# Patient Record
Sex: Male | Born: 2000 | Race: White | Hispanic: No | Marital: Single | Smoking: Never smoker
Health system: Southern US, Community
[De-identification: ages and names within clinical notes are randomized; demographics above are authoritative.]

## PROBLEM LIST (undated history)

## (undated) DIAGNOSIS — J309 Allergic rhinitis, unspecified: Secondary | ICD-10-CM

## (undated) HISTORY — DX: Allergic rhinitis, unspecified: J30.9

## (undated) HISTORY — PX: HAND SURGERY: SHX662

---
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
## 2020-03-11 IMAGING — DX DG FOOT COMPLETE 3+V*L*
3 series · 3 of 3 positions shown · non-contrast
Comparison: None.

CLINICAL DATA: Injury to LEFT foot while at work, pain and
swelling.

EXAM:
LEFT FOOT - COMPLETE 3+ VIEW

[foot ap]
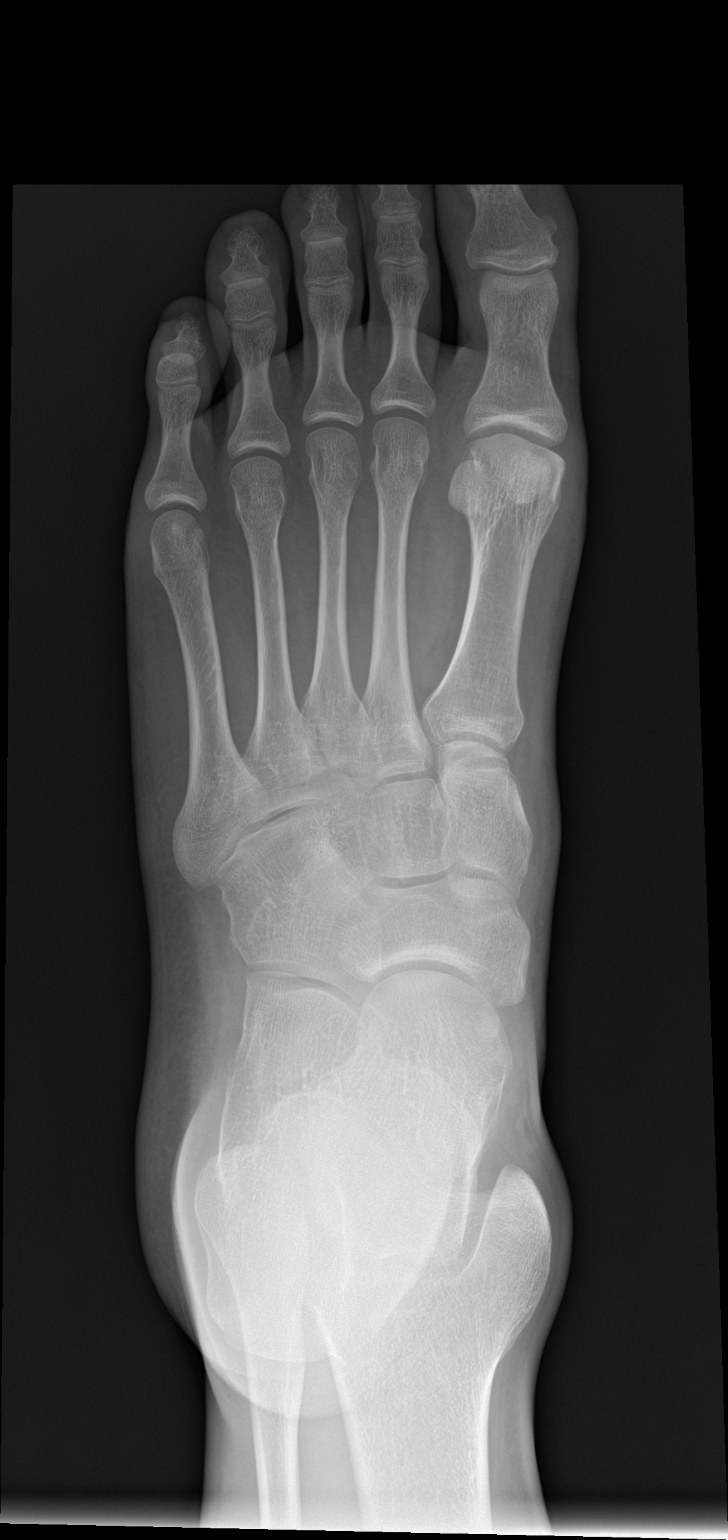

[foot obl]
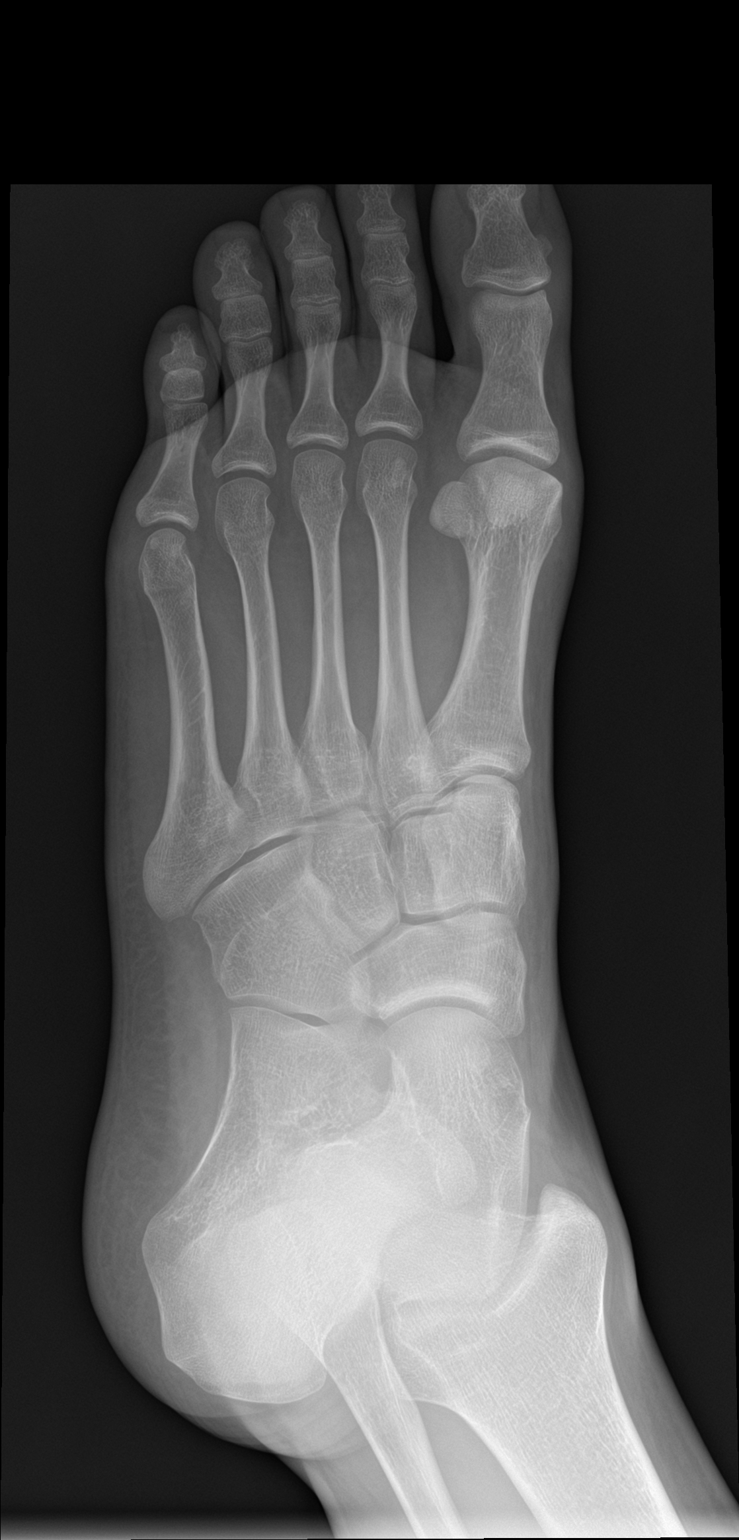

[foot lat]
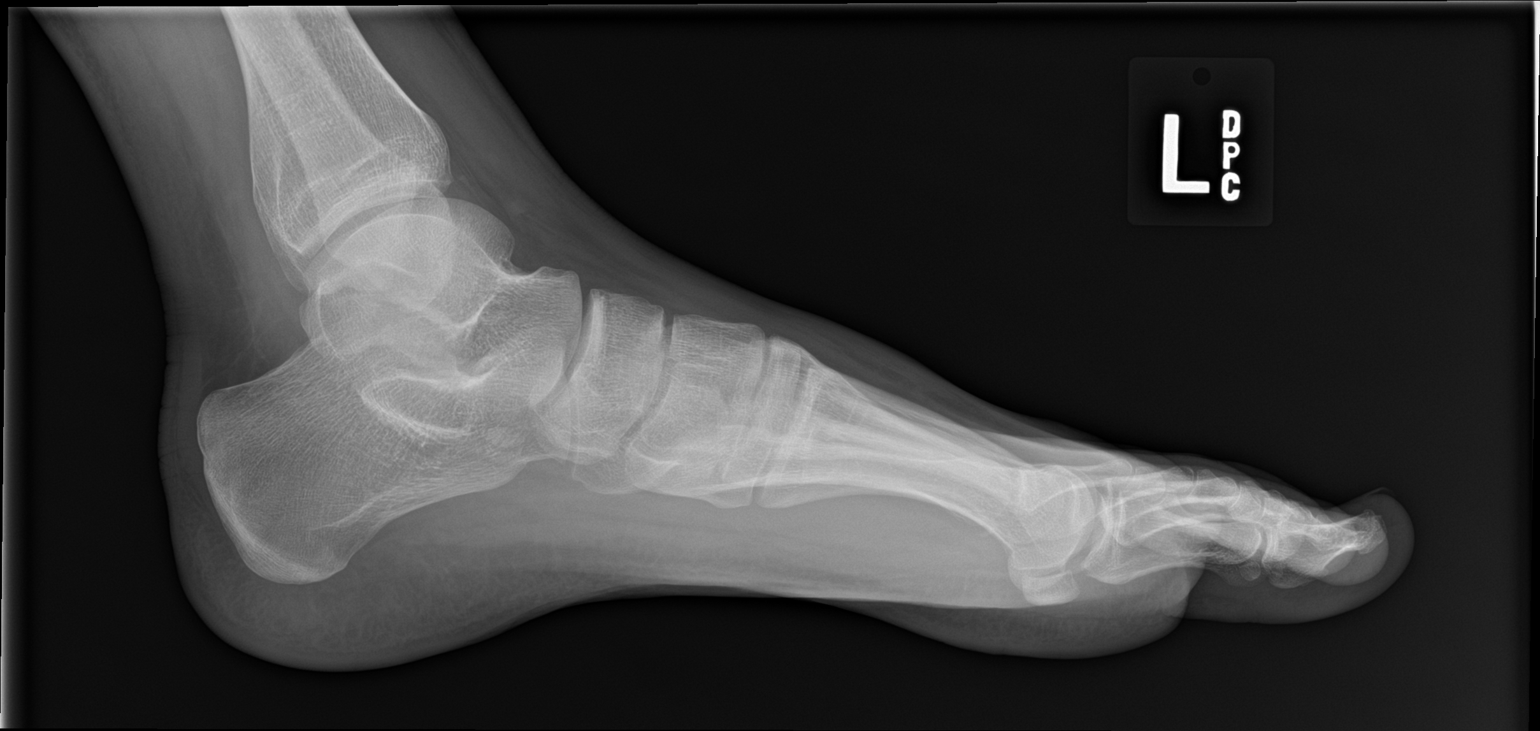

[3 of 3 positions shown; findings below may reference images not displayed]

FINDINGS: Osseous alignment is normal. Bone mineralization is normal. No
fracture line or displaced fracture fragment seen. Adjacent soft
tissues are unremarkable.
IMPRESSION: Negative.

## 2020-06-21 ENCOUNTER — Ambulatory Visit (INDEPENDENT_AMBULATORY_CARE_PROVIDER_SITE_OTHER): Payer: Self-pay | Admitting: Otolaryngology

## 2020-10-24 ENCOUNTER — Other Ambulatory Visit: Payer: Self-pay

## 2020-10-24 ENCOUNTER — Ambulatory Visit (INDEPENDENT_AMBULATORY_CARE_PROVIDER_SITE_OTHER): Payer: BC Managed Care – PPO

## 2020-10-24 ENCOUNTER — Ambulatory Visit: Payer: BC Managed Care – PPO | Admitting: Podiatry

## 2020-10-24 DIAGNOSIS — S9030XA Contusion of unspecified foot, initial encounter: Secondary | ICD-10-CM

## 2020-10-24 DIAGNOSIS — M79671 Pain in right foot: Secondary | ICD-10-CM

## 2020-10-24 DIAGNOSIS — M79672 Pain in left foot: Secondary | ICD-10-CM

## 2020-10-24 DIAGNOSIS — M722 Plantar fascial fibromatosis: Secondary | ICD-10-CM

## 2020-10-24 MED ORDER — MELOXICAM 7.5 MG PO TABS: 7.5000 mg | ORAL_TABLET | Freq: Every day | ORAL | 0 refills | Status: DC

## 2020-10-24 NOTE — Patient Instructions (Signed)

## 2020-10-28 NOTE — Progress Notes (Signed)
Subjective:   Patient ID: Isaiah Campbell, male   DOB: 20 y.o.   MRN: [id number]   HPI 20 year old male presents the office with concerns of bilateral heel pain.  Describes a stinging, bruise from running steel.  He says it hurts after being on his feet for too long.  Is been on about 5 to 6 months.  No radiating pain or weakness.  He states that work seems to aggravate his symptoms as he stands.  He is on vacation and had less discomfort to his heel.  No history of rheumatoid or other autoimmune issues.   Review of Systems  All other systems reviewed and are negative.  No past medical history on file.  Past Surgical History:  Procedure Laterality Date   HAND SURGERY       Current Outpatient Medications:    meloxicam (MOBIC) 7.5 MG tablet, Take 1 tablet (7.5 mg total) by mouth daily., Disp: 30 tablet, Rfl: 0   doxycycline (VIBRA-TABS) 100 MG tablet, Take 100 mg by mouth 2 (two) times daily., Disp: , Rfl:   Allergies  Allergen Reactions   Sulfa Antibiotics           Objective:  Physical Exam  General: AAO x3, NAD  Dermatological: Skin is warm, dry and supple bilateral. Nails x 10 are well manicured; remaining integument appears unremarkable at this time. There are no open sores, no preulcerative lesions, no rash or signs of infection present.  Vascular: Dorsalis Pedis artery and Posterior Tibial artery pedal pulses are 2/4 bilateral with immedate capillary fill time. Pedal hair growth present. No varicosities and no lower extremity edema present bilateral. There is no pain with calf compression, swelling, warmth, erythema.   Neruologic: Grossly intact via light touch bilateral.  Negative Tinel sign.  Musculoskeletal: Tenderness to palpation along the plantar medial tubercle of the calcaneus at the insertion of plantar fascia on the left and right foot. There is no pain along the course of the plantar fascia within the arch of the foot. Plantar fascia appears to be intact. There  is no pain with lateral compression of the calcaneus or pain with vibratory sensation. There is no pain along the course or insertion of the achilles tendon. No other areas of tenderness to bilateral lower extremities. Muscular strength 5/5 in all groups tested bilateral.  Pronation present  Gait: Unassisted, Nonantalgic.       Assessment:    Bilateral heel pain, plantar fasciitis     Plan:  -Treatment options discussed including all alternatives, risks, and complications -Etiology of symptoms were discussed -X-rays were obtained and reviewed with the patient.  No evidence of acute fracture or stress fracture. -Steroid injection discussed we held off on this today.  Prescribe meloxicam discussed side effects.  Per step inserts.  Discussed shoe modifications and good arch support.  Stretching, icing daily.  Vivi Barrack DPM

## 2020-11-21 ENCOUNTER — Telehealth: Payer: Self-pay | Admitting: Podiatry

## 2020-11-21 NOTE — Telephone Encounter (Signed)
Patient's Father called stating that he is still having bilateral foot pain and wants to know if they need to make another appointment or if you want to try him on a different medication

## 2020-11-22 NOTE — Telephone Encounter (Signed)
I called the Patients father back this morning He states he will call back to make Assunta Found a appointment.

## 2020-11-26 ENCOUNTER — Other Ambulatory Visit: Payer: Self-pay

## 2020-11-26 ENCOUNTER — Encounter: Payer: Self-pay | Admitting: Podiatry

## 2020-11-26 ENCOUNTER — Ambulatory Visit: Payer: BC Managed Care – PPO | Admitting: Podiatry

## 2020-11-26 DIAGNOSIS — M722 Plantar fascial fibromatosis: Secondary | ICD-10-CM | POA: Diagnosis not present

## 2020-11-26 DIAGNOSIS — M79671 Pain in right foot: Secondary | ICD-10-CM

## 2020-11-26 DIAGNOSIS — M79672 Pain in left foot: Secondary | ICD-10-CM | POA: Diagnosis not present

## 2020-11-26 MED ORDER — METHYLPREDNISOLONE 4 MG PO TBPK: ORAL_TABLET | ORAL | 0 refills | Status: DC

## 2020-11-26 NOTE — Patient Instructions (Signed)
Start taking the medrol dose pack (steroid) and once complete you can restart the mobic.   Plantar Fasciitis (Heel Spur Syndrome) with Rehab The plantar fascia is a fibrous, ligament-like, soft-tissue structure that spans the bottom of the foot. Plantar fasciitis is a condition that causes pain in the foot due to inflammation of the tissue. SYMPTOMS  Pain and tenderness on the underneath side of the foot. Pain that worsens with standing or walking. CAUSES  Plantar fasciitis is caused by irritation and injury to the plantar fascia on the underneath side of the foot. Common mechanisms of injury include: Direct trauma to bottom of the foot. Damage to a small nerve that runs under the foot where the main fascia attaches to the heel bone. Stress placed on the plantar fascia due to bone spurs. RISK INCREASES WITH:  Activities that place stress on the plantar fascia (running, jumping, pivoting, or cutting). Poor strength and flexibility. Improperly fitted shoes. Tight calf muscles. Flat feet. Failure to warm-up properly before activity. Obesity. PREVENTION Warm up and stretch properly before activity. Allow for adequate recovery between workouts. Maintain physical fitness: Strength, flexibility, and endurance. Cardiovascular fitness. Maintain a health body weight. Avoid stress on the plantar fascia. Wear properly fitted shoes, including arch supports for individuals who have flat feet.  PROGNOSIS  If treated properly, then the symptoms of plantar fasciitis usually resolve without surgery. However, occasionally surgery is necessary.  RELATED COMPLICATIONS  Recurrent symptoms that may result in a chronic condition. Problems of the lower back that are caused by compensating for the injury, such as limping. Pain or weakness of the foot during push-off following surgery. Chronic inflammation, scarring, and partial or complete fascia tear, occurring more often from repeated  injections.  TREATMENT  Treatment initially involves the use of ice and medication to help reduce pain and inflammation. The use of strengthening and stretching exercises may help reduce pain with activity, especially stretches of the Achilles tendon. These exercises may be performed at home or with a therapist. Your caregiver may recommend that you use heel cups of arch supports to help reduce stress on the plantar fascia. Occasionally, corticosteroid injections are given to reduce inflammation. If symptoms persist for greater than 6 months despite non-surgical (conservative), then surgery may be recommended.   MEDICATION  If pain medication is necessary, then nonsteroidal anti-inflammatory medications, such as aspirin and ibuprofen, or other minor pain relievers, such as acetaminophen, are often recommended. Do not take pain medication within 7 days before surgery. Prescription pain relievers may be given if deemed necessary by your caregiver. Use only as directed and only as much as you need. Corticosteroid injections may be given by your caregiver. These injections should be reserved for the most serious cases, because they may only be given a certain number of times.  HEAT AND COLD Cold treatment (icing) relieves pain and reduces inflammation. Cold treatment should be applied for 10 to 15 minutes every 2 to 3 hours for inflammation and pain and immediately after any activity that aggravates your symptoms. Use ice packs or massage the area with a piece of ice (ice massage). Heat treatment may be used prior to performing the stretching and strengthening activities prescribed by your caregiver, physical therapist, or athletic trainer. Use a heat pack or soak the injury in warm water.  SEEK IMMEDIATE MEDICAL CARE IF: Treatment seems to offer no benefit, or the condition worsens. Any medications produce adverse side effects.  EXERCISES- RANGE OF MOTION (ROM) AND STRETCHING EXERCISES - Plantar  Fasciitis (Heel Spur Syndrome) These exercises may help you when beginning to rehabilitate your injury. Your symptoms may resolve with or without further involvement from your physician, physical therapist or athletic trainer. While completing these exercises, remember:  Restoring tissue flexibility helps normal motion to return to the joints. This allows healthier, less painful movement and activity. An effective stretch should be held for at least 30 seconds. A stretch should never be painful. You should only feel a gentle lengthening or release in the stretched tissue.  RANGE OF MOTION - Toe Extension, Flexion Sit with your right / left leg crossed over your opposite knee. Grasp your toes and gently pull them back toward the top of your foot. You should feel a stretch on the bottom of your toes and/or foot. Hold this stretch for 10 seconds. Now, gently pull your toes toward the bottom of your foot. You should feel a stretch on the top of your toes and or foot. Hold this stretch for 10 seconds. Repeat  times. Complete this stretch 3 times per day.   RANGE OF MOTION - Ankle Dorsiflexion, Active Assisted Remove shoes and sit on a chair that is preferably not on a carpeted surface. Place right / left foot under knee. Extend your opposite leg for support. Keeping your heel down, slide your right / left foot back toward the chair until you feel a stretch at your ankle or calf. If you do not feel a stretch, slide your bottom forward to the edge of the chair, while still keeping your heel down. Hold this stretch for 10 seconds. Repeat 3 times. Complete this stretch 2 times per day.   STRETCH  Gastroc, Standing Place hands on wall. Extend right / left leg, keeping the front knee somewhat bent. Slightly point your toes inward on your back foot. Keeping your right / left heel on the floor and your knee straight, shift your weight toward the wall, not allowing your back to arch. You should feel a  gentle stretch in the right / left calf. Hold this position for 10 seconds. Repeat 3 times. Complete this stretch 2 times per day.  STRETCH  Soleus, Standing Place hands on wall. Extend right / left leg, keeping the other knee somewhat bent. Slightly point your toes inward on your back foot. Keep your right / left heel on the floor, bend your back knee, and slightly shift your weight over the back leg so that you feel a gentle stretch deep in your back calf. Hold this position for 10 seconds. Repeat 3 times. Complete this stretch 2 times per day.  STRETCH  Gastrocsoleus, Standing  Note: This exercise can place a lot of stress on your foot and ankle. Please complete this exercise only if specifically instructed by your caregiver.  Place the ball of your right / left foot on a step, keeping your other foot firmly on the same step. Hold on to the wall or a rail for balance. Slowly lift your other foot, allowing your body weight to press your heel down over the edge of the step. You should feel a stretch in your right / left calf. Hold this position for 10 seconds. Repeat this exercise with a slight bend in your right / left knee. Repeat 3 times. Complete this stretch 2 times per day.   STRENGTHENING EXERCISES - Plantar Fasciitis (Heel Spur Syndrome)  These exercises may help you when beginning to rehabilitate your injury. They may resolve your symptoms with or without further involvement   from your physician, physical therapist or athletic trainer. While completing these exercises, remember:  Muscles can gain both the endurance and the strength needed for everyday activities through controlled exercises. Complete these exercises as instructed by your physician, physical therapist or athletic trainer. Progress the resistance and repetitions only as guided.  STRENGTH - Towel Curls Sit in a chair positioned on a non-carpeted surface. Place your foot on a towel, keeping your heel on the  floor. Pull the towel toward your heel by only curling your toes. Keep your heel on the floor. Repeat 3 times. Complete this exercise 2 times per day.  STRENGTH - Ankle Inversion Secure one end of a rubber exercise band/tubing to a fixed object (table, pole). Loop the other end around your foot just before your toes. Place your fists between your knees. This will focus your strengthening at your ankle. Slowly, pull your big toe up and in, making sure the band/tubing is positioned to resist the entire motion. Hold this position for 10 seconds. Have your muscles resist the band/tubing as it slowly pulls your foot back to the starting position. Repeat 3 times. Complete this exercises 2 times per day.  Document Released: 03/23/2005 Document Revised: 06/15/2011 Document Reviewed: 07/05/2008 [employer] Patient Information 2014 [employer].  

## 2020-11-26 NOTE — Progress Notes (Signed)
Subjective: 20 year old male presents the office today for concerns of continued heel pain on both sides.  He thinks the left may be somewhat worse than the right but about the same.  He gets the majority symptoms from standing at work all day.  He did not have any heel pain before he changed jobs and is standing is much as he lives.  He gets some discomfort in the morning but the majority of pain is after being on his feet.  No recent injury or trauma.  No swelling.  No numbness or tingling.  He did try the power step inserts without significant improvement as well as the anti-inflammatories.  Objective: AAO x3, NAD DP/PT pulses palpable bilaterally, CRT less than 3 seconds There is a decrease in medial arch height upon weightbearing.  The majority of tenderness is still localized on the plantar medial tubercle of the calcaneus at the insertion of plantar fascial bilaterally.  There is no pain with lateral compression of calcaneus.  No pain with Achilles tendon.  No other areas of discomfort.  Ankle, subtalar joint range of motion intact.  No edema, erythema.   No open lesions or pre-ulcerative lesions.  No pain with calf compression, swelling, warmth, erythema  Assessment: Bilateral heel pain, plantar fasciitis, flatfoot  Plan: -All treatment options discussed with the patient including all alternatives, risks, complications.  -At this point we will do a Medrol Dosepak which I prescribed.  Once this is complete he can restart the anti-inflammatories.  Continue stretching, icing daily.  Dispensed a night splint.  Also will do custom inserts.  He was measured for these today.  Also the shoes that here wearing her sketchers and there quite old.  We discussed getting a new pair shoes.  If he continues to have symptoms we discussed alternative treatments including EPAT.  -Patient encouraged to call the office with any questions, concerns, change in symptoms.   Isaiah Campbell DPM

## 2020-11-27 ENCOUNTER — Telehealth: Payer: Self-pay | Admitting: Podiatry

## 2020-11-27 NOTE — Telephone Encounter (Signed)
Pt called requesting a different medication due to having excessive hiccups as a side effect. Please advise.

## 2020-11-28 ENCOUNTER — Other Ambulatory Visit: Payer: Self-pay | Admitting: Podiatry

## 2020-11-28 MED ORDER — DICLOFENAC SODIUM 75 MG PO TBEC: 75.0000 mg | DELAYED_RELEASE_TABLET | Freq: Two times a day (BID) | ORAL | 0 refills | Status: DC

## 2020-12-19 ENCOUNTER — Other Ambulatory Visit: Payer: Self-pay | Admitting: Podiatry

## 2020-12-26 ENCOUNTER — Ambulatory Visit (INDEPENDENT_AMBULATORY_CARE_PROVIDER_SITE_OTHER): Payer: BC Managed Care – PPO

## 2020-12-26 ENCOUNTER — Other Ambulatory Visit: Payer: Self-pay

## 2020-12-26 DIAGNOSIS — M722 Plantar fascial fibromatosis: Secondary | ICD-10-CM

## 2020-12-26 NOTE — Progress Notes (Signed)
Patient in office today to pick up custom orthotics. Patient is wearing a pair of sketchers brand shoes today. Patient tried on the new custom orthotics and was satisfied with the fit and feel of the custom inserts. Patient was educated on the break-in process at this time. Patient verbalized understanding. Advised patient to call the office with any questions, comments or concerns.

## 2021-01-17 ENCOUNTER — Ambulatory Visit: Payer: BC Managed Care – PPO | Admitting: Podiatry

## 2022-01-14 ENCOUNTER — Telehealth: Payer: Self-pay | Admitting: Podiatry

## 2022-01-14 NOTE — Telephone Encounter (Signed)
Patient father called they need to order another pair of orthotics, he has been leaving messages on the voicemail.  Since it has been over a year since the patient has been seen does he need to see Dr Jacqualyn Posey again or can he come in for a new fitting?  Please advise they need asap.

## 2022-02-13 NOTE — Telephone Encounter (Signed)
Called to schedule pt for an appt for orthotics casting

## 2022-03-18 ENCOUNTER — Encounter: Payer: Self-pay | Admitting: Internal Medicine

## 2022-04-28 ENCOUNTER — Ambulatory Visit: Payer: Self-pay | Admitting: Gastroenterology

## 2023-01-20 ENCOUNTER — Encounter: Payer: Self-pay | Admitting: Internal Medicine

## 2023-01-20 ENCOUNTER — Other Ambulatory Visit: Payer: Self-pay | Admitting: *Deleted

## 2023-01-20 ENCOUNTER — Encounter: Payer: Self-pay | Admitting: *Deleted

## 2023-01-20 ENCOUNTER — Ambulatory Visit (INDEPENDENT_AMBULATORY_CARE_PROVIDER_SITE_OTHER): Payer: BC Managed Care – PPO | Admitting: Internal Medicine

## 2023-01-20 VITALS — BP 124/74 | HR 72 | Temp 98.0°F | Ht 71.0 in | Wt 203.2 lb

## 2023-01-20 DIAGNOSIS — R109 Unspecified abdominal pain: Secondary | ICD-10-CM | POA: Diagnosis not present

## 2023-01-20 DIAGNOSIS — Z8379 Family history of other diseases of the digestive system: Secondary | ICD-10-CM

## 2023-01-20 DIAGNOSIS — K625 Hemorrhage of anus and rectum: Secondary | ICD-10-CM

## 2023-01-20 DIAGNOSIS — K6289 Other specified diseases of anus and rectum: Secondary | ICD-10-CM

## 2023-01-20 DIAGNOSIS — R194 Change in bowel habit: Secondary | ICD-10-CM

## 2023-01-20 MED ORDER — NA SULFATE-K SULFATE-MG SULF 17.5-3.13-1.6 GM/177ML PO SOLN: ORAL | 0 refills | Status: DC

## 2023-01-20 NOTE — Patient Instructions (Signed)
We will schedule you for colonoscopy to further evaluate your rectal bleeding, intermittent abdominal discomfort, rectal discomfort.  It was very nice meeting you today.  Best of luck to you in your ongoing training to be [employer].  Dr. Marletta Lor

## 2023-01-20 NOTE — Progress Notes (Signed)
Primary Care Physician:  Richardean Chimera, MD Primary Gastroenterologist:  Dr. Marletta Lor  Chief Complaint  Patient presents with   Blood In Stools    HPI:   Isaiah Campbell is a 22 y.o. male who presents to clinic today by referral from his PCP Lorie Phenix for evaluation.  Patient states he was in his normal state of health until approximately 4 to 5 months ago when he had development of rectal bleeding.  Primarily with bowel movements.  Notes bright red blood both on tissue paper and in toilet bowl.  Happens 4-5 times a week.  Mild rectal discomfort with bowel movement which she describes as sharp.  Some occasional intermittent abdominal pain which he attributes to stress.  Diagnosed with Salmonella in June 2024.  At that time was having significant diarrhea as well as bleeding and fever.  This has resolved for the most part.  Does note some straining at times with his bowel movements.  He reports his stools have changed.,  Occasionally stringy, has not noticed any mucus.  Does note his father suffers from Crohn's disease.  No family history of colorectal malignancy.  No previous colonoscopy.  Denies any upper GI symptoms including heartburn, reflux, dysphagia/odynophagia, epigastric or chest pain.  Currently in training program for criminal justice system.  Past Medical History:  Diagnosis Date   Allergic rhinitis     Past Surgical History:  Procedure Laterality Date   HAND SURGERY      Current Outpatient Medications  Medication Sig Dispense Refill   fexofenadine (ALLEGRA) 180 MG tablet Take 180 mg by mouth daily.     No current facility-administered medications for this visit.    Allergies as of 01/20/2023 - Review Complete 01/20/2023  Allergen Reaction Noted   Sulfa antibiotics  12/02/2017    Family History  Problem Relation Age of Onset   Crohn's disease Father     Social History   Socioeconomic History   Marital status: Single    Spouse name: Not on file    Number of children: Not on file   Years of education: Not on file   Highest education level: Not on file  Occupational History   Not on file  Tobacco Use   Smoking status: Never   Smokeless tobacco: Never  Vaping Use   Vaping status: Never Used  Substance and Sexual Activity   Alcohol use: Never   Drug use: Never   Sexual activity: Not Currently  Other Topics Concern   Not on file  Social History Narrative   Not on file   Social Determinants of Health   Financial Resource Strain: Not on file  Food Insecurity: Not on file  Transportation Needs: Not on file  Physical Activity: Not on file  Stress: Not on file  Social Connections: Not on file  Intimate Partner Violence: Not on file    Subjective: Review of Systems  Constitutional:  Negative for chills and fever.  HENT:  Negative for congestion and hearing loss.   Eyes:  Negative for blurred vision and double vision.  Respiratory:  Negative for cough and shortness of breath.   Cardiovascular:  Negative for chest pain and palpitations.  Gastrointestinal:  Positive for blood in stool and constipation. Negative for abdominal pain, diarrhea, heartburn, melena and vomiting.  Genitourinary:  Negative for dysuria and urgency.  Musculoskeletal:  Negative for joint pain and myalgias.  Skin:  Negative for itching and rash.  Neurological:  Negative for dizziness and headaches.  Psychiatric/Behavioral:  Negative for depression. The patient is not nervous/anxious.        Objective: BP 124/74 (BP Location: Left Arm, Patient Position: Sitting, Cuff Size: Normal)   Pulse 72   Temp 98 F (36.7 C) (Oral)   Ht 5\' 11"  (1.803 m)   Wt 203 lb 3.2 oz (92.2 kg)   SpO2 97%   BMI 28.34 kg/m  Physical Exam Constitutional:      Appearance: Normal appearance.  HENT:     Head: Normocephalic and atraumatic.  Eyes:     Extraocular Movements: Extraocular movements intact.     Conjunctiva/sclera: Conjunctivae normal.  Cardiovascular:      Rate and Rhythm: Normal rate and regular rhythm.  Pulmonary:     Effort: Pulmonary effort is normal.     Breath sounds: Normal breath sounds.  Abdominal:     General: Bowel sounds are normal.     Palpations: Abdomen is soft.  Musculoskeletal:        General: Normal range of motion.     Cervical back: Normal range of motion and neck supple.  Skin:    General: Skin is warm.  Neurological:     General: No focal deficit present.     Mental Status: He is alert and oriented to person, place, and time.  Psychiatric:        Mood and Affect: Mood normal.        Behavior: Behavior normal.   Rectal exam: Deferred by patient until time of colonoscopy   Assessment: *Rectal bleeding-chronic *Abdominal discomfort *Rectal discomfort *Family history of Crohn's disease  Plan: Discussed symptoms in depth with patient today.  Likely benign anorectal source such as hemorrhoidal disease.  That being said given the chronicity of his symptoms as well as family history of Crohn's disease, will pursue colonoscopy to further evaluate.  The risks including infection, bleed, or perforation as well as benefits, limitations, alternatives and imponderables have been reviewed with the patient. Questions have been answered. All parties agreeable.  Rectal exam deferred by patient today until time of colonoscopy.  Thank you Lorie Phenix for the kind referral   01/20/2023 8:49 AM   Disclaimer: This note was dictated with voice recognition software. Similar sounding words can inadvertently be transcribed and may not be corrected upon review.

## 2023-03-15 ENCOUNTER — Other Ambulatory Visit: Payer: Self-pay | Admitting: *Deleted

## 2023-03-15 MED ORDER — NA SULFATE-K SULFATE-MG SULF 17.5-3.13-1.6 GM/177ML PO SOLN: ORAL | 0 refills | Status: DC

## 2023-03-15 NOTE — Progress Notes (Signed)
Patient father called in and asked for [place]. I have done so.

## 2023-03-18 ENCOUNTER — Encounter (HOSPITAL_COMMUNITY): Payer: Self-pay

## 2023-03-18 ENCOUNTER — Encounter (HOSPITAL_COMMUNITY)
Admission: RE | Admit: 2023-03-18 | Discharge: 2023-03-18 | Disposition: A | Discharge status: Home / Self Care | Payer: BC Managed Care – PPO | Source: Ambulatory Visit | Attending: Internal Medicine | Admitting: Internal Medicine

## 2023-03-22 ENCOUNTER — Ambulatory Visit (HOSPITAL_COMMUNITY): Payer: Self-pay | Admitting: Anesthesiology

## 2023-03-22 ENCOUNTER — Encounter (HOSPITAL_COMMUNITY)
Admission: RE | Disposition: A | Discharge status: Home / Self Care | Payer: Self-pay | Source: Home / Self Care | Attending: Internal Medicine

## 2023-03-22 ENCOUNTER — Telehealth: Payer: Self-pay | Admitting: Gastroenterology

## 2023-03-22 ENCOUNTER — Encounter (HOSPITAL_COMMUNITY): Payer: Self-pay

## 2023-03-22 ENCOUNTER — Ambulatory Visit (HOSPITAL_COMMUNITY)
Admission: RE | Admit: 2023-03-22 | Discharge: 2023-03-22 | Disposition: A | Discharge status: Home / Self Care | Payer: BC Managed Care – PPO | Attending: Internal Medicine | Admitting: Internal Medicine

## 2023-03-22 ENCOUNTER — Other Ambulatory Visit: Payer: Self-pay

## 2023-03-22 DIAGNOSIS — R1084 Generalized abdominal pain: Secondary | ICD-10-CM | POA: Diagnosis present

## 2023-03-22 DIAGNOSIS — K625 Hemorrhage of anus and rectum: Secondary | ICD-10-CM | POA: Diagnosis present

## 2023-03-22 DIAGNOSIS — K6289 Other specified diseases of anus and rectum: Secondary | ICD-10-CM | POA: Diagnosis not present

## 2023-03-22 DIAGNOSIS — K648 Other hemorrhoids: Secondary | ICD-10-CM | POA: Insufficient documentation

## 2023-03-22 DIAGNOSIS — I10 Essential (primary) hypertension: Secondary | ICD-10-CM | POA: Diagnosis not present

## 2023-03-22 DIAGNOSIS — K602 Anal fissure, unspecified: Secondary | ICD-10-CM | POA: Insufficient documentation

## 2023-03-22 HISTORY — PX: COLONOSCOPY WITH PROPOFOL: SHX5780

## 2023-03-22 SURGERY — COLONOSCOPY WITH PROPOFOL
Anesthesia: General

## 2023-03-22 MED ORDER — PROPOFOL 10 MG/ML IV BOLUS
INTRAVENOUS | Status: DC | PRN
  Administered 2023-03-22: 160 mg via INTRAVENOUS

## 2023-03-22 MED ORDER — LIDOCAINE HCL (CARDIAC) PF 100 MG/5ML IV SOSY
PREFILLED_SYRINGE | INTRAVENOUS | Status: DC | PRN
  Administered 2023-03-22: 100 mg via INTRAVENOUS

## 2023-03-22 MED ORDER — PROPOFOL 1000 MG/100ML IV EMUL
INTRAVENOUS | Status: AC
Start: 2023-03-22 — End: ?
  Filled 2023-03-22: qty 100

## 2023-03-22 MED ORDER — LACTATED RINGERS IV SOLN: INTRAVENOUS | Status: DC | PRN

## 2023-03-22 MED ORDER — DEXMEDETOMIDINE HCL IN NACL 80 MCG/20ML IV SOLN
INTRAVENOUS | Status: AC
  Filled 2023-03-22: qty 20

## 2023-03-22 MED ORDER — PROPOFOL 500 MG/50ML IV EMUL
INTRAVENOUS | Status: DC | PRN
  Administered 2023-03-22: 150 ug/kg/min via INTRAVENOUS

## 2023-03-22 MED ORDER — LIDOCAINE HCL (PF) 2 % IJ SOLN
INTRAMUSCULAR | Status: AC
  Filled 2023-03-22: qty 5

## 2023-03-22 MED ORDER — DEXMEDETOMIDINE HCL IN NACL 80 MCG/20ML IV SOLN
INTRAVENOUS | Status: DC | PRN
  Administered 2023-03-22: 8 ug via INTRAVENOUS

## 2023-03-22 NOTE — Telephone Encounter (Signed)
[facility] cream compounded with nitroglycerin called into pharmacy [facility]). Apply 4 times a day, disp 30 grams with 2 refills.     Please let patient know that the pharmacy will call him and go over pricing before compounding this. Needs to wear gloves when applying and wash hands thereafter. Sometimes, the cream can cause a headache. If so, let us know, so we can change this.

## 2023-03-22 NOTE — Anesthesia Preprocedure Evaluation (Signed)
 Anesthesia Evaluation  Patient identified by MRN, date of birth, ID band Patient awake    Reviewed: Allergy & Precautions, H&P , NPO status , Patient's Chart, lab work & pertinent test results, reviewed documented beta blocker date and time   Airway Mallampati: II  TM Distance: >3 FB Neck ROM: full    Dental no notable dental hx.    Pulmonary neg pulmonary ROS   Pulmonary exam normal breath sounds clear to auscultation       Cardiovascular Exercise Tolerance: Good hypertension, negative cardio ROS  Rhythm:regular Rate:Normal     Neuro/Psych negative neurological ROS  negative psych ROS   GI/Hepatic negative GI ROS, Neg liver ROS,,,  Endo/Other  negative endocrine ROS    Renal/GU negative Renal ROS  negative genitourinary   Musculoskeletal   Abdominal   Peds  Hematology negative hematology ROS (+)   Anesthesia Other Findings   Reproductive/Obstetrics negative OB ROS                             Anesthesia Physical Anesthesia Plan  ASA: 1  Anesthesia Plan: General   Post-op Pain Management:    Induction:   PONV Risk Score and Plan: Propofol infusion  Airway Management Planned:   Additional Equipment:   Intra-op Plan:   Post-operative Plan:   Informed Consent: I have reviewed the patients History and Physical, chart, labs and discussed the procedure including the risks, benefits and alternatives for the proposed anesthesia with the patient or authorized representative who has indicated his/her understanding and acceptance.     Dental Advisory Given  Plan Discussed with: CRNA  Anesthesia Plan Comments:        Anesthesia Quick Evaluation

## 2023-03-22 NOTE — Transfer of Care (Addendum)
Immediate Anesthesia Transfer of Care Note  Patient: Isaiah Campbell  Procedure(s) Performed: COLONOSCOPY WITH PROPOFOL  Patient Location: Short Stay  Anesthesia Type:General  Level of Consciousness: drowsy and patient cooperative  Airway & Oxygen Therapy: Patient Spontanous Breathing and Patient connected to nasal cannula oxygen  Post-op Assessment: Report given to RN and Post -op Vital signs reviewed and stable  Post vital signs: Reviewed and stable  Last Vitals:  Vitals Value Taken Time  BP 101/64 03/22/23   0758  Temp 36.6 03/22/23   0758  Pulse 72 03/22/23   0758  Resp 21 03/22/23   0758  SpO2 100% 03/22/23   0758    Last Pain:  Vitals:   03/22/23 0731  PainSc: 0-No pain         Complications: No notable events documented.

## 2023-03-22 NOTE — Discharge Instructions (Addendum)
  Colonoscopy Discharge Instructions  Read the instructions outlined below and refer to this sheet in the next few weeks. These discharge instructions provide you with general information on caring for yourself after you leave the hospital. Your doctor may also give you specific instructions. While your treatment has been planned according to the most current medical practices available, unavoidable complications occasionally occur.   ACTIVITY You may resume your regular activity, but move at a slower pace for the next 24 hours.  Take frequent rest periods for the next 24 hours.  Walking will help get rid of the air and reduce the bloated feeling in your belly (abdomen).  No driving for 24 hours (because of the medicine (anesthesia) used during the test).   Do not sign any important legal documents or operate any machinery for 24 hours (because of the anesthesia used during the test).  NUTRITION Drink plenty of fluids.  You may resume your normal diet as instructed by your doctor.  Begin with a light meal and progress to your normal diet. Heavy or fried foods are harder to digest and may make you feel sick to your stomach (nauseated).  Avoid alcoholic beverages for 24 hours or as instructed.  MEDICATIONS You may resume your normal medications unless your doctor tells you otherwise.  WHAT YOU CAN EXPECT TODAY Some feelings of bloating in the abdomen.  Passage of more gas than usual.  Spotting of blood in your stool or on the toilet paper.  IF YOU HAD POLYPS REMOVED DURING THE COLONOSCOPY: No aspirin products for 7 days or as instructed.  No alcohol for 7 days or as instructed.  Eat a soft diet for the next 24 hours.  FINDING OUT THE RESULTS OF YOUR TEST Not all test results are available during your visit. If your test results are not back during the visit, make an appointment with your caregiver to find out the results. Do not assume everything is normal if you have not heard from your  caregiver or the medical facility. It is important for you to follow up on all of your test results.  SEEK IMMEDIATE MEDICAL ATTENTION IF: You have more than a spotting of blood in your stool.  Your belly is swollen (abdominal distention).  You are nauseated or vomiting.  You have a temperature over 101.  You have abdominal pain or discomfort that is severe or gets worse throughout the day.   Overall, your colon appeared very healthy.  I did not see any active inflammation indicative of underlying inflammatory bowel disease such as Crohn's disease or ulcerative colitis throughout your colon or end portion of your small bowel.    You do have an anal fissure which is likely causing discomfort.  I will send in topical nitroglycerin cream to use twice daily.  This is a compounded cream and needs to be sent to [facility].  They will call you before they proceed with prescription.  Follow-up in GI office in 6 to 8 weeks.  I hope you have a great rest of your week!  Hennie Duos. Marletta Lor, D.O. Gastroenterology and Hepatology [facility]

## 2023-03-22 NOTE — Telephone Encounter (Signed)
Rx called in to pharmacy. 

## 2023-03-22 NOTE — Anesthesia Postprocedure Evaluation (Signed)
Anesthesia Post Note  Patient: Isaiah Campbell  Procedure(s) Performed: COLONOSCOPY WITH PROPOFOL  Patient location during evaluation: Phase II Anesthesia Type: General Level of consciousness: awake Pain management: pain level controlled Vital Signs Assessment: post-procedure vital signs reviewed and stable Respiratory status: spontaneous breathing and respiratory function stable Cardiovascular status: blood pressure returned to baseline and stable Postop Assessment: no headache and no apparent nausea or vomiting Anesthetic complications: no Comments: Late entry   No notable events documented.   Last Vitals:  Vitals:   03/22/23 0645 03/22/23 0758  BP: 125/77 101/64  Pulse: 95 69  Resp: (!) 24 (!) 21  Temp: 36.9 C 36.6 C  SpO2: 100% 100%    Last Pain:  Vitals:   03/22/23 0758  TempSrc: Axillary  PainSc: Asleep                 Windell Norfolk

## 2023-03-22 NOTE — H&P (Signed)
Primary Care Physician:  Richardean Chimera, MD Primary Gastroenterologist:  Dr. Marletta Lor  Pre-Procedure History & Physical: HPI:  Isaiah Campbell is a 22 y.o. male is here for a colonoscopy to be performed for rectal bleeding, rectal pain, abdominal pain  Past Medical History:  Diagnosis Date   Allergic rhinitis     Past Surgical History:  Procedure Laterality Date   HAND SURGERY      Prior to Admission medications   Medication Sig Start Date End Date Taking? Authorizing Provider  fexofenadine (ALLEGRA) 180 MG tablet Take 180 mg by mouth daily.    [provider]  Na Sulfate-K Sulfate-Mg Sulf 17.5-3.13-1.6 GM/177ML SOLN As directed 03/15/23   Lanelle Bal, DO    Allergies as of 01/20/2023 - Review Complete 01/20/2023  Allergen Reaction Noted   Sulfa antibiotics  12/02/2017    Family History  Problem Relation Age of Onset   Crohn's disease Father     Social History   Socioeconomic History   Marital status: Single    Spouse name: Not on file   Number of children: Not on file   Years of education: Not on file   Highest education level: Not on file  Occupational History   Not on file  Tobacco Use   Smoking status: Never   Smokeless tobacco: Never  Vaping Use   Vaping status: Never Used  Substance and Sexual Activity   Alcohol use: Never   Drug use: Never   Sexual activity: Not Currently  Other Topics Concern   Not on file  Social History Narrative   Not on file   Social Drivers of Health   Financial Resource Strain: Not on file  Food Insecurity: Not on file  Transportation Needs: Not on file  Physical Activity: Not on file  Stress: Not on file  Social Connections: Not on file  Intimate Partner Violence: Not on file    Review of Systems: See HPI, otherwise negative ROS  Physical Exam: Vital signs in last 24 hours: Temp:  [98.4 F (36.9 C)] 98.4 F (36.9 C) (12/16 0645) Pulse Rate:  [95] 95 (12/16 0645) Resp:  [24] 24 (12/16 0645) BP:  (125)/(77) 125/77 (12/16 0645) SpO2:  [100 %] 100 % (12/16 0645)   General:   Alert,  Well-developed, well-nourished, pleasant and cooperative in NAD Head:  Normocephalic and atraumatic. Eyes:  Sclera clear, no icterus.   Conjunctiva pink. Ears:  Normal auditory acuity. Nose:  No deformity, discharge,  or lesions. Msk:  Symmetrical without gross deformities. Normal posture. Extremities:  Without clubbing or edema. Neurologic:  Alert and  oriented x4;  grossly normal neurologically. Skin:  Intact without significant lesions or rashes. Psych:  Alert and cooperative. Normal mood and affect.  Impression/Plan: Isaiah Campbell is here for a colonoscopy to be performed for rectal bleeding, rectal pain, abdominal pain  The risks of the procedure including infection, bleed, or perforation as well as benefits, limitations, alternatives and imponderables have been reviewed with the patient. Questions have been answered. All parties agreeable.

## 2023-03-22 NOTE — Op Note (Signed)
[facility] : [facility] Patient Name: Isaiah Campbell Procedure Date: 03/22/2023 7:00 AM MRN: [id number] Date of Birth: 11-22-2000 Attending MD: Hennie Duos. Marletta Lor , [place], [id number] CSN: [id number] Age: 22 Admit Type: Outpatient Procedure:                Colonoscopy Indications:              Generalized abdominal pain, Rectal bleeding, Rectal                            pain Providers:                Hennie Duos. Marletta Lor, DO, Crystal Page, Lennice Sites                            Technician, Pensions consultant Referring MD:              Medicines:                See the Anesthesia note for documentation of the                            administered medications Complications:             Estimated Blood Loss:     Estimated blood loss: none. Procedure:                Pre-Anesthesia Assessment:                           - The anesthesia plan was to use monitored                            anesthesia care (MAC).                           After obtaining informed consent, the colonoscope                            was passed under direct vision. Throughout the                            procedure, the patient's blood pressure, pulse, and                            oxygen saturations were monitored continuously. The                            [id number]-HQ190L ([id number]) scope was introduced through                            the anus and advanced to the the terminal ileum,                            with identification of the appendiceal orifice and                            IC valve. The colonoscopy was performed without  difficulty. The patient tolerated the procedure                            well. The quality of the bowel preparation was                            evaluated using the BBPS [facility] Bowel Preparation                            Scale) with scores of: Right Colon = 3, Transverse                            Colon = 3 and Left Colon = 3 (entire mucosa seen                             well with no residual staining, small fragments of                            stool or opaque liquid). The total BBPS score                            equals 9. Scope In: 7:40:50 AM Scope Out: 7:52:08 AM Scope Withdrawal Time: 0 hours 8 minutes 43 seconds  Total Procedure Duration: 0 hours 11 minutes 18 seconds  Findings:      The perianal exam findings include anal fissure.      Non-bleeding internal hemorrhoids were found during retroflexion.      The terminal ileum appeared normal.      The entire examined colon appeared normal. Impression:               - Anal fissure found on perianal exam.                           - Non-bleeding internal hemorrhoids.                           - The examined portion of the ileum was normal.                           - The entire examined colon is normal.                           - No specimens collected. Moderate Sedation:      Per Anesthesia Care Recommendation:           - Patient has a contact number available for                            emergencies. The signs and symptoms of potential                            delayed complications were discussed with the                            patient. Return to normal  activities tomorrow.                            Written discharge instructions were provided to the                            patient.                           - Resume previous diet.                           - Continue present medications.                           - Await pathology results.                           - Repeat colonoscopy at age 60 or sooner if higher                            risk for screening purposes.                           - Return to GI clinic in 8 weeks.                           - Will send in topical NTG rectal cream to [facility] Procedure Code(s):        --- Professional ---                           [phone], Colonoscopy, flexible; diagnostic, including                             collection of specimen(s) by brushing or washing,                            when performed (separate procedure) Diagnosis Code(s):        --- Professional ---                           K60.2, Anal fissure, unspecified                           K64.8, Other hemorrhoids                           R10.84, Generalized abdominal pain                           K62.5, Hemorrhage of anus and rectum                           K62.89, Other specified diseases of anus and rectum CPT copyright 2022 American Medical  Association. All rights reserved. The codes documented in this report are preliminary and upon coder review may  be revised to meet current compliance requirements. Hennie Duos. Marletta Lor, DO Hennie Duos. Marletta Lor, DO 03/22/2023 7:56:21 AM This report has been signed electronically. Number of Addenda: 0

## 2023-03-23 NOTE — Telephone Encounter (Signed)
Pt was made aware and verbalized understanding.  

## 2023-04-08 ENCOUNTER — Encounter (HOSPITAL_COMMUNITY): Payer: Self-pay | Admitting: Internal Medicine

## 2023-04-29 ENCOUNTER — Encounter: Payer: Self-pay | Admitting: Internal Medicine

## 2023-05-24 ENCOUNTER — Telehealth: Payer: Self-pay | Admitting: Internal Medicine

## 2023-05-24 NOTE — Telephone Encounter (Signed)
Pt's father, Roe Coombs, called to say patient had a procedure with Dr Marletta Lor on 12/16 and needed to speak to the nurse about some issues he was having since. He said patient was in [employer] and will need a follow up with Dr Marletta Lor in mid April and he wants Dr Marletta Lor only and to see what he would recommend until OV. Please call [phone]

## 2023-05-25 ENCOUNTER — Telehealth: Payer: Self-pay

## 2023-05-25 NOTE — Telephone Encounter (Signed)
error 

## 2023-05-25 NOTE — Telephone Encounter (Signed)
Phoned and spoke with the pt's father Deondre Marinaro and was advised that the pt has a fissure that will not heal. At one point it was better because they use stool softeners and fiber but now its back to bothering him whenever he has a BM. He is taking Ibuprofen for the pain. They are wanting a recommendation until his appt with you in April ( I don't see that appt has been made). He advised me of a cream pt was using but I did not see it under his medication list. Please advise

## 2023-05-31 NOTE — Telephone Encounter (Signed)
 Please send in prescription for NTG compounded cream to [facility]. You will need to call the pharmacy in this regard. Apply twice daily x30 days with 1 refill. Thank you

## 2023-06-01 NOTE — Telephone Encounter (Signed)
 Phoned to [facility] [phone] and spoke with the pharmacist. Advised of Rx for NTG compounded rectal cream; apply X 2 for 30 days with one refill. Gave pt's phone number.
